# Patient Record
Sex: Male | Born: 1975 | Hispanic: Yes | Marital: Single | Smoking: Current every day smoker
Health system: Southern US, Community
[De-identification: ages and names within clinical notes are randomized; demographics above are authoritative.]

## PROBLEM LIST (undated history)

## (undated) DIAGNOSIS — R109 Unspecified abdominal pain: Secondary | ICD-10-CM

## (undated) HISTORY — DX: Unspecified abdominal pain: R10.9

---

## 2006-09-17 ENCOUNTER — Emergency Department: Payer: Self-pay

## 2012-12-21 ENCOUNTER — Emergency Department: Payer: Self-pay | Admitting: Internal Medicine

## 2014-10-01 IMAGING — CR DG CHEST 2V
1 series · 2 of 2 positions shown · non-contrast
Comparison: none

REASON FOR EXAM: cough
COMMENTS:

PROCEDURE:     DXR - DXR CHEST PA (OR AP) AND LATERAL  - December 21, 2012 [DATE]
RESULT:     The lungs are clear. The cardiac silhouette and visualized bony
skeleton are unremarkable.

[Series 1: w chest pa · 0.14mm/px · 2 of 2 slices shown]
[im 1/2]
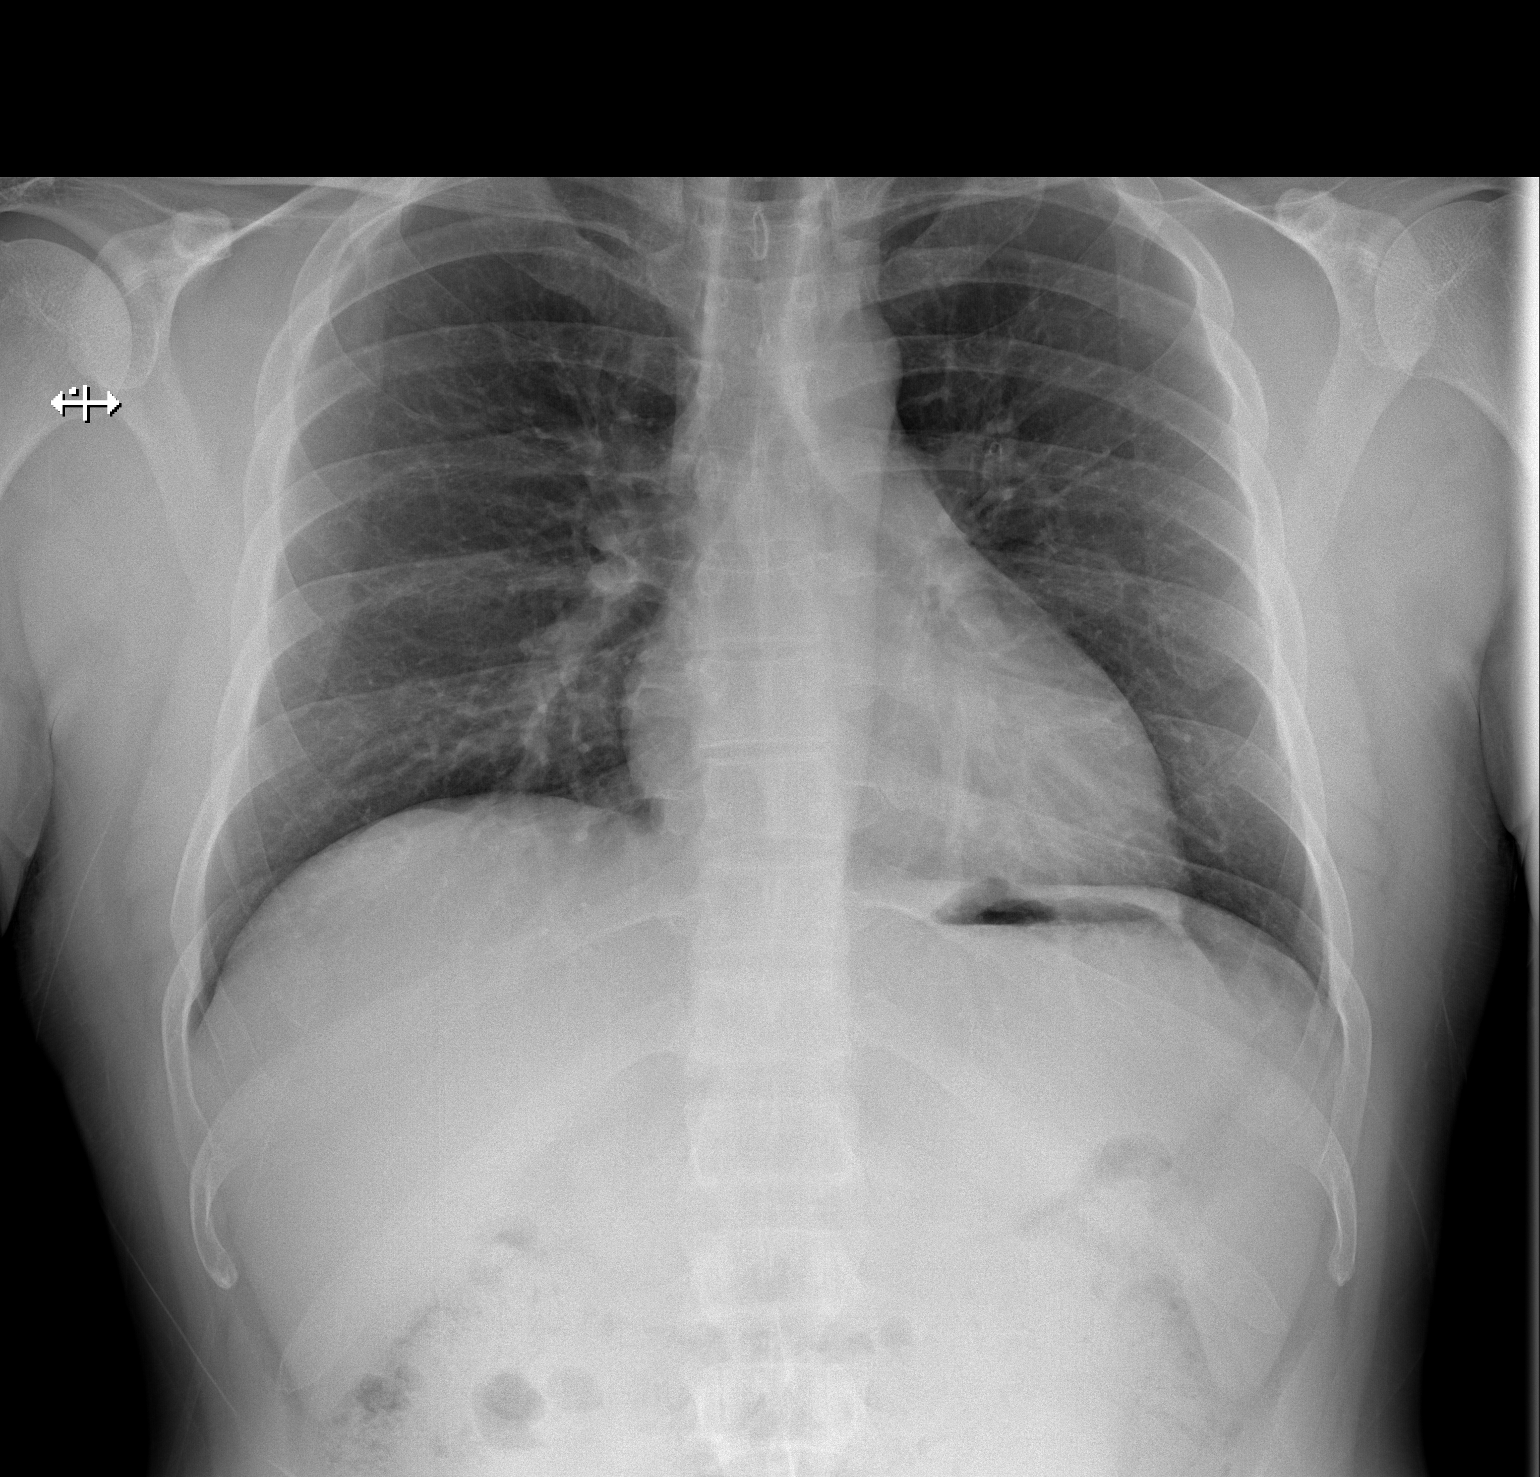
[im 2/2]
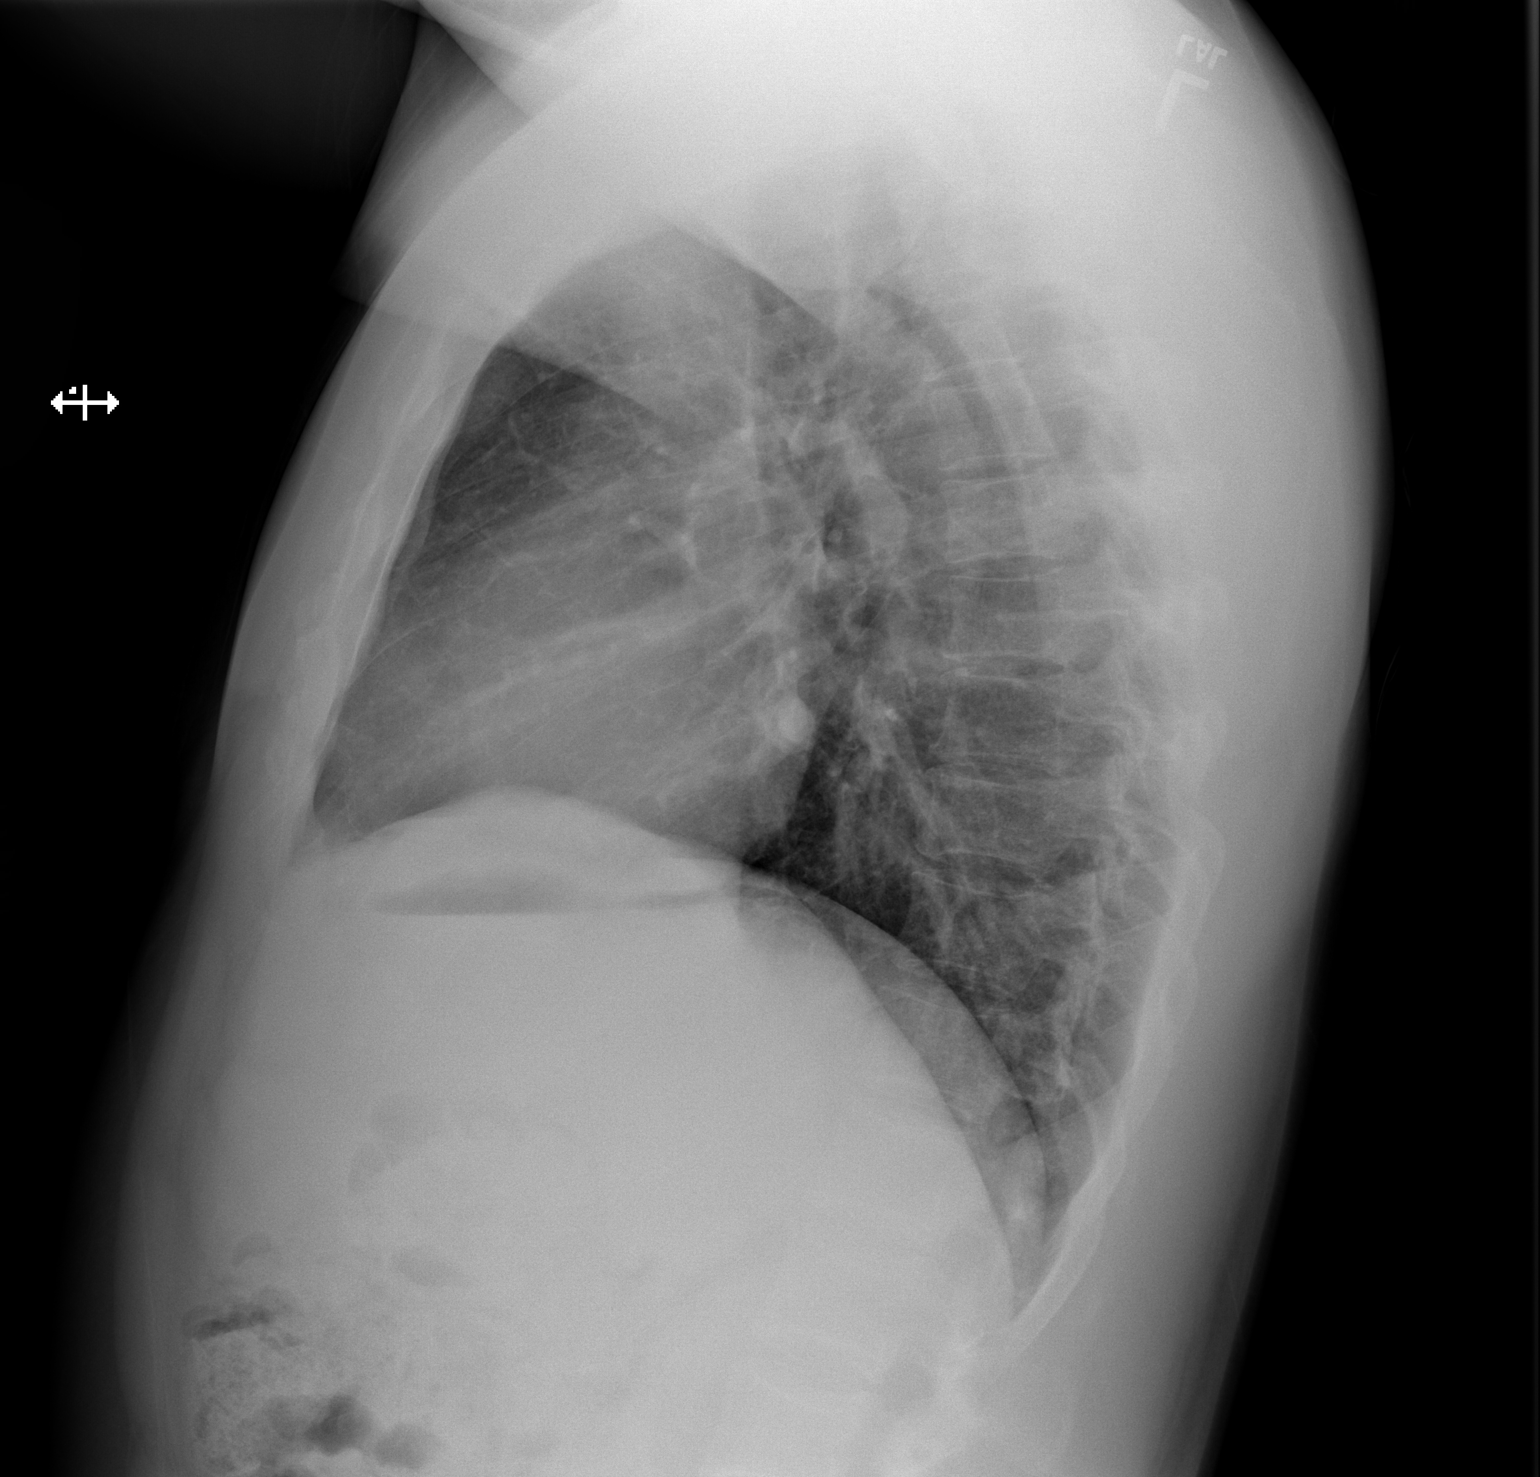

[2 of 2 positions shown; findings below may reference images not displayed]

IMPRESSION: 1. Chest radiograph without evidence of acute cardiopulmonary disease.

## 2015-03-20 ENCOUNTER — Encounter: Payer: Self-pay | Admitting: Medical Oncology

## 2015-03-20 ENCOUNTER — Other Ambulatory Visit: Payer: Self-pay

## 2015-03-20 ENCOUNTER — Emergency Department: Payer: BLUE CROSS/BLUE SHIELD

## 2015-03-20 ENCOUNTER — Emergency Department
Admission: EM | Admit: 2015-03-20 | Discharge: 2015-03-20 | Disposition: A | Discharge status: Home / Self Care | Payer: BLUE CROSS/BLUE SHIELD | Attending: Emergency Medicine | Admitting: Emergency Medicine

## 2015-03-20 DIAGNOSIS — K2970 Gastritis, unspecified, without bleeding: Secondary | ICD-10-CM | POA: Insufficient documentation

## 2015-03-20 DIAGNOSIS — K8020 Calculus of gallbladder without cholecystitis without obstruction: Secondary | ICD-10-CM | POA: Diagnosis not present

## 2015-03-20 DIAGNOSIS — R1013 Epigastric pain: Secondary | ICD-10-CM

## 2015-03-20 DIAGNOSIS — Z720 Tobacco use: Secondary | ICD-10-CM | POA: Insufficient documentation

## 2015-03-20 LAB — COMPREHENSIVE METABOLIC PANEL
ALBUMIN: 4.5 g/dL (ref 3.5–5.0)
ALT: 26 U/L (ref 17–63)
AST: 21 U/L (ref 15–41)
Alkaline Phosphatase: 66 U/L (ref 38–126)
Anion gap: 10 (ref 5–15)
BUN: 10 mg/dL (ref 6–20)
CALCIUM: 9.4 mg/dL (ref 8.9–10.3)
CO2: 27 mmol/L (ref 22–32)
Chloride: 99 mmol/L — ABNORMAL LOW (ref 101–111)
Creatinine, Ser: 0.84 mg/dL (ref 0.61–1.24)
GFR calc Af Amer: >60 mL/min (ref >60)
GFR calc non Af Amer: >60 mL/min (ref >60)
GLUCOSE: 147 mg/dL — AB (ref 65–99)
Potassium: 4.1 mmol/L (ref 3.5–5.1)
SODIUM: 136 mmol/L (ref 135–145)
Total Bilirubin: 0.4 mg/dL (ref 0.3–1.2)
Total Protein: 7.9 g/dL (ref 6.5–8.1)

## 2015-03-20 LAB — CBC WITH DIFFERENTIAL/PLATELET
BASOS ABS: 0 10*3/uL (ref 0–0.1)
BASOS PCT: 0 %
EOS ABS: 0 10*3/uL (ref 0–0.7)
Eosinophils Relative: 0 %
HCT: 48.1 % (ref 40.0–52.0)
Hemoglobin: 16 g/dL (ref 13.0–18.0)
Lymphocytes Relative: 6 %
Lymphs Abs: 0.6 10*3/uL — ABNORMAL LOW (ref 1.0–3.6)
MCH: 29.3 pg (ref 26.0–34.0)
MCHC: 33.2 g/dL (ref 32.0–36.0)
MCV: 88.1 fL (ref 80.0–100.0)
Monocytes Absolute: 0.2 10*3/uL (ref 0.2–1.0)
Monocytes Relative: 2 %
NEUTROS PCT: 92 %
Neutro Abs: 8.3 10*3/uL — ABNORMAL HIGH (ref 1.4–6.5)
PLATELETS: 293 10*3/uL (ref 150–440)
RBC: 5.46 MIL/uL (ref 4.40–5.90)
RDW: 12.8 % (ref 11.5–14.5)
WBC: 9 10*3/uL (ref 3.8–10.6)

## 2015-03-20 LAB — URINALYSIS COMPLETE WITH MICROSCOPIC (ARMC ONLY)
Bacteria, UA: NONE SEEN
Bilirubin Urine: NEGATIVE
Glucose, UA: NEGATIVE mg/dL
Hgb urine dipstick: NEGATIVE
Ketones, ur: NEGATIVE mg/dL
Leukocytes, UA: NEGATIVE
NITRITE: NEGATIVE
PH: 9 — AB (ref 5.0–8.0)
PROTEIN: NEGATIVE mg/dL
Specific Gravity, Urine: 1.015 (ref 1.005–1.030)
Squamous Epithelial / LPF: NONE SEEN

## 2015-03-20 LAB — LIPASE, BLOOD: Lipase: 24 U/L (ref 22–51)

## 2015-03-20 LAB — TROPONIN I: Troponin I: <0.03 ng/mL (ref <0.031)

## 2015-03-20 MED ORDER — ONDANSETRON 4 MG PO TBDP
4.0000 mg | ORAL_TABLET | Freq: Once | ORAL | Status: AC
  Administered 2015-03-20: 4 mg via ORAL
  Filled 2015-03-20: qty 1

## 2015-03-20 MED ORDER — GI COCKTAIL ~~LOC~~
30.0000 mL | Freq: Once | ORAL | Status: AC
  Administered 2015-03-20: 30 mL via ORAL
  Filled 2015-03-20: qty 30

## 2015-03-20 MED ORDER — HYDROCODONE-ACETAMINOPHEN 5-325 MG PO TABS: 1.0000 | ORAL_TABLET | Freq: Four times a day (QID) | ORAL | Status: DC | PRN

## 2015-03-20 MED ORDER — HYDROCODONE-ACETAMINOPHEN 5-325 MG PO TABS
1.0000 | ORAL_TABLET | Freq: Once | ORAL | Status: AC
  Administered 2015-03-20: 1 via ORAL

## 2015-03-20 MED ORDER — HYDROCODONE-ACETAMINOPHEN 5-325 MG PO TABS
ORAL_TABLET | ORAL | Status: AC
  Administered 2015-03-20: 1 via ORAL
  Filled 2015-03-20: qty 1

## 2015-03-20 MED ORDER — HYDROMORPHONE HCL 1 MG/ML IJ SOLN
1.0000 mg | Freq: Once | INTRAMUSCULAR | Status: AC
  Administered 2015-03-20: 1 mg via INTRAMUSCULAR
  Filled 2015-03-20: qty 1

## 2015-03-20 NOTE — ED Provider Notes (Signed)
[facility] Emergency Department Provider Note   ____________________________________________  Time seen: 11 AM I have reviewed the triage vital signs and the triage nursing note.  HISTORY  Chief Complaint Abdominal Pain and Emesis   Historian Patient, through Spanish interpreter  HPI Ricardo Curtis is a 39 y.o. male who woke up this morning around 2 AM having epigastric pain that is moderate to severe, constant and ongoing. Feels a little bit like burning. No chest pain. No shortness of breath or trouble breathing. He vomited times one which was nonbloody and nonbilious. No lower abdominal pain and no bowel changes. He does eat a lot of spicy food. Some mild history of GERD or indigestion but not recently. He tried Tums which didn't seem to help much.    History reviewed. No pertinent past medical history.  There are no active problems to display for this patient.   History reviewed. No pertinent past surgical history.  Current Outpatient Rx  Name  Route  Sig  Dispense  Refill  . HYDROcodone-acetaminophen (NORCO/VICODIN) 5-325 MG per tablet   Oral   Take 1 tablet by mouth every 6 (six) hours as needed for moderate pain.   5 tablet   0     Allergies Review of patient's allergies indicates no known allergies.  No family history on file.  Social History Social History  Substance Use Topics  . Smoking status: Current Every Day Smoker  . Smokeless tobacco: None  . Alcohol Use: Yes     Comment: occ    Review of Systems  Constitutional: Negative for fever. Eyes: Negative for visual changes. ENT: Negative for sore throat. Cardiovascular: Negative for chest pain. Respiratory: Negative for shortness of breath. Gastrointestinal: Negative for constipation or diarrhea. Genitourinary: Negative for dysuria. Musculoskeletal: Negative for back pain. Skin: Negative for rash. Neurological: Negative for headaches, focal weakness or  numbness. 10 point Review of Systems otherwise negative ____________________________________________   PHYSICAL EXAM:  VITAL SIGNS: ED Triage Vitals  Enc Vitals Group     BP 03/20/15 0943 135/78 mmHg     Pulse Rate 03/20/15 0943 49     Resp 03/20/15 0943 20     Temp 03/20/15 0943 97.8 F (36.6 C)     Temp Source 03/20/15 0943 Oral     SpO2 03/20/15 0943 99 %     Weight 03/20/15 0943 160 lb (72.576 kg)     Height --      Head Cir --      Peak Flow --      Pain Score 03/20/15 0950 10     Pain Loc --      Pain Edu? --      Excl. in GC? --      Constitutional: Alert and oriented. Well appearing and in no distress. Eyes: Conjunctivae are normal. PERRL. Normal extraocular movements. ENT   Head: Normocephalic and atraumatic.   Nose: No congestion/rhinnorhea.   Mouth/Throat: Mucous membranes are moist.   Neck: No stridor. Cardiovascular/Chest: Normal rate, regular rhythm.  No murmurs, rubs, or gallops. Respiratory: Normal respiratory effort without tachypnea nor retractions. Breath sounds are clear and equal bilaterally. No wheezes/rales/rhonchi. Gastrointestinal: Soft. No distention, no guarding, no rebound. Moderate tenderness in the epigastrium. No focal right upper quadrant tenderness. No lower abdominal tenderness and no McBurney's point tenderness. Genitourinary/rectal:Deferred Musculoskeletal: Nontender with normal range of motion in all extremities. No joint effusions.  No lower extremity tenderness nor edema. Neurologic:  Normal speech and language. No gross or  focal neurologic deficits are appreciated. Skin:  Skin is warm, dry and intact. No rash noted. Psychiatric: Mood and affect are normal. Speech and behavior are normal. Patient exhibits appropriate insight and judgment.  ____________________________________________   EKG I, Governor Rooks, MD, the attending physician have personally viewed and interpreted all ECGs.  49 bpm. Her sinus bradycardia.  Narrow QRS. Normal axis. Normal ST and T-wave. ____________________________________________  LABS (pertinent positives/negatives)  Urinalysis negative Complete metabolic panel without significant abnormality, including normal LFTs Lipase 24 CBC within normal limits Troponin less than 0.03  ____________________________________________  RADIOLOGY All Xrays were viewed by me. Imaging interpreted by Radiologist.  Ultrasound right upper quadrant: cholelithiasis no biliary dilatation __________________________________________  PROCEDURES  Procedure(s) performed: None Critical Care performed: None  ____________________________________________   ED COURSE / ASSESSMENT AND PLAN  CONSULTATIONS: None  Pertinent labs & imaging results that were available during my care of the patient were reviewed by me and considered in my medical decision making (see chart for details).  Clinically patient has symptoms of GERD/gastritis.symptoms are not significantly improved with jet cocktail. For symptom control and to give him 1 dose of Dilaudid with Zofran. Given the upper abdominal tenderness although labs are reassuring, I initially tried to just about every for gastritis. However the patient has continued pain after check cocktail. I obtained a right upper quadrant ultrasound was positive for gallstones, however no sign of obstruction or cholecystitis. I discussed this with the patient through the interpreter. Patient pain was under control after Dilaudid. Patient will be discharged with some Norco. Referred to surgery and primary care.  Patient / Family / Caregiver informed of clinical course, medical decision-making process, and agree with plan.   I discussed return precautions, follow-up instructions, and discharged instructions with patient and/or family.  ___________________________________________   FINAL CLINICAL IMPRESSION(S) / ED DIAGNOSES   Final diagnoses:  Gastritis   Epigastric pain  Gallstones    FOLLOW UP  Referred to: primary care physician referred to [facility] clinic, one week. Refer to surgery Dr. Michela Pitcher.   Governor Rooks, MD 03/20/15 [phone]

## 2015-03-20 NOTE — Discharge Instructions (Signed)
Your ultrasound showed signs of gallstones. I'm not sure if your pain is coming from stomach acid reflux or possibly gallstones.  Avoid spicy foods, caffeine, citrus and acidic foods for 2 weeks. Try over-the-counter acid reducer Prilosec 40 mg daily for 10 days.  Return to the emergency department for any worsening condition including worsening abdominal pain, any chest pain, sweating, trouble breathing, fever, black or bloody stools, or any other symptoms concerning to you. Follow-up with primary care physician, and you're referred to the [place] clinic.also recommend follow-up with General surgeon Dr. Michela Pitcher.  Ultrasonido muestra sintomas de piedras en la vesicula. Evite comidas picantes, cafeina, comidas o jugos acidos. Tome Prilosec 40 mg diario por 10 dias.  Regrese a emergencia si sintomas empeoran. Seguimiento con su doctor de cabecera y un cirujano Dr. Michela Pitcher.               Gastritis - Adultos  (Gastritis, Adult)  Gastrittis es la hinchazn e irritacin (inflamacin) del revestimiento interno del estmago. Si no recibe tratamiento, la gastritis puede causar sangrado y llagas.(lceras) en el estmago. CUIDADOS EN EL HOGAR   Slo tome los medicamentos segn le indique el mdico.  Si le han recetado antibiticos, tmelos segn las indicaciones. Termine de [employer], aunque comience a sentirse mejor.  Beba gran cantidad de lquido para mantener el pis (orina) de tono claro o amarillo plido.  Evite las comidas y bebidas que [employer]. Los alimentos que debe evitar son:  G[place] y alcohol.  Chocolate.  Menta.  Ajo y cebolla.  Comidas muy condimentadas.  Ctricos como naranjas, limones o limas.  Alimentos que contengan tomate, como salsas, [place] y pizza.  Alimentos fritos y Lexicographer.  Haga comidas pequeas durante Glass blower/designer de 3 comidas abundantes. SOLICITE AYUDA DE INMEDIATO SI:   La materia fecal (heces)es negra o de color rojo  oscuro.  Vomita sangre. Puede ser similar a la borra del caf  No puede retener los lquidos.  El dolor en el vientre (abdominal) empeora.  Tiene fiebre.  No mejora luego de 1 semana.  Tiene preguntas o preocupaciones. ASEGRESE DE QUE:   Comprende estas instrucciones.  Controlar su enfermedad.  Solicitar ayuda de inmediato si no mejora o si empeora. Document Released: 01/26/2012 [facility] Patient Information 2015 [place], [place]. This information is not intended to replace advice given to you by your health care provider. Make sure you discuss any questions you have with your health care provider.   Colelitiasis (Cholelithiasis) La colelitiasis (tambin llamada clculos en la vescula) es una enfermedad de la vescula. La vescula es un rgano pequeo que ayuda a digerir las [place]. Los sntomas de clculos en la vescula son:  Caleen [place] de vomitar (nuseas).  Devolver la comida (vomitar).  Dolor en el vientre.  Piel amarilla (ictericia)  Dolor sbito. Podr sentir Chief Technology Officer durante algunos minutos o unas horas.  Grant Ruts.  Dolor a Insurance claims handler. CUIDADOS EN EL HOGAR  Tome slo los medicamentos segn le haya indicado el mdico.  Siga una dieta baja en grasas hasta que vuelva a ver al mdico nuevamente. Consumir grasas puede Programmer, multimedia.  Concurra a las consultas de control con el mdico, segn las indicaciones. Los ataques generalmente ocurren repetidas veces. Generalmente se requiere de [employer]. SOLICITE AYUDA DE INMEDIATO SI:   El dolor empeora.  El dolor no se alivia con los [employer].  Tiene fiebre o sntomas que persisten durante ms de 2-3 das.  Tiene fiebre y los sntomas  empeoran repentinamente.  Siente nuseas y vomita. ASEGRESE DE QUE:   Comprende estas instrucciones.  Controlar su afeccin.  Recibir ayuda de inmediato si no mejora o si empeora. Document Released: 10/23/2008 Document Revised:  03/29/2013 [facility] Patient Information 2015 [place], [place]. This information is not intended to replace advice given to you by your health care provider. Make sure you discuss any questions you have with your health care provider.

## 2015-03-20 NOTE — ED Notes (Signed)
Assumed care of patient from [place], [place]. Patient resting comfortably. Family at bedside. States pain is still the same and still in center of abdomen.

## 2015-03-20 NOTE — ED Notes (Signed)
Pt to triage with reports that he began having epigastic pain this am around 0200 this am, pt reports he vomited x 1 since then.

## 2015-03-21 ENCOUNTER — Telehealth: Payer: Self-pay

## 2015-03-21 ENCOUNTER — Ambulatory Visit: Payer: Self-pay | Admitting: Surgery

## 2015-03-21 NOTE — Telephone Encounter (Signed)
Patient called me regarding his condition. He stated that he continues to have abdominal pain and was requesting a refill on his Norco. I told him that because we have not seen him in the office, that we are not able to refill his Norco. I did advise him to alternate taking Ibuprofen every six hours and Acetaminophen every four hours for his pain. I also told patient that if his pain was unbearable to please go to the emergency room. Patient understood and had no further questions. I did remind him that if he didn't go to the emergency room, that we would see him on Monday at the [facility] office. Patient understood as well.

## 2015-03-25 ENCOUNTER — Ambulatory Visit (INDEPENDENT_AMBULATORY_CARE_PROVIDER_SITE_OTHER): Payer: BLUE CROSS/BLUE SHIELD | Admitting: Surgery

## 2015-03-25 ENCOUNTER — Encounter: Payer: Self-pay | Admitting: Surgery

## 2015-03-25 VITALS — BP 133/74 | HR 60 | Temp 98.1°F | Ht 66.0 in | Wt 185.0 lb

## 2015-03-25 DIAGNOSIS — R1011 Right upper quadrant pain: Secondary | ICD-10-CM

## 2015-03-25 NOTE — H&P (Signed)
CC: RUQ pain  HPI: Ricardo Curtis is a pleasant 38 yo M who presents following 1 episode of RUQ/epigastric pain 2 days ago. Was seen in ER and diagnosed with gallstones.  No pain currently.  First episode of pain.  No fevers/chills, night sweats, shortness of breath, cough, chest pain, diarrhea/constipation, dysuria/hematuria.    Active Ambulatory Problems    Diagnosis Date Noted  . Abdominal pain, right upper quadrant 03/25/2015   Resolved Ambulatory Problems    Diagnosis Date Noted  . No Resolved Ambulatory Problems   Past Medical History  Diagnosis Date  . Abdominal pain    History reviewed. No pertinent past surgical history.   No Known Allergies   Social History   Social History  . Marital Status: Married    Spouse Name: N/A  . Number of Children: N/A  . Years of Education: N/A   Occupational History  . Not on file.   Social History Main Topics  . Smoking status: Current Every Day Smoker  . Smokeless tobacco: Not on file  . Alcohol Use: 0.0 oz/week    0 Standard drinks or equivalent per week     Comment: occ  . Drug Use: No  . Sexual Activity: Not on file   Other Topics Concern  . Not on file   Social History Narrative   Family History  Problem Relation Age of Onset  . Alcohol abuse Neg Hx   . Arthritis Neg Hx   . Asthma Neg Hx   . Birth defects Neg Hx   . Cancer Neg Hx   . COPD Neg Hx   . Depression Neg Hx   . Diabetes Neg Hx   . Drug abuse Neg Hx   . Early death Neg Hx   . Hearing loss Neg Hx   . Heart disease Neg Hx   . Hyperlipidemia Neg Hx   . Hypertension Neg Hx   . Kidney disease Neg Hx   . Learning disabilities Neg Hx   . Mental illness Neg Hx   . Mental retardation Neg Hx   . Miscarriages / Stillbirths Neg Hx   . Stroke Neg Hx   . Vision loss Neg Hx   . Varicose Veins Neg Hx    ROS: Full ROS obtained, pertinent positives and negatives as above  Blood pressure 133/74, pulse 60, temperature 98.1 F (36.7 C), temperature source Oral,  height 5' 6" (1.676 m), weight 185 lb (83.915 kg). GEN: NAD/A&Ox3 FACE: no obvious facial trauma, normal external nose, normal external ears EYES: no scleral icterus, no conjunctivitis HEAD: normocephalic atraumatic CV: RRR, no MRG RESP: moving air well, lungs clear ABD: soft, nontender, nondistended EXT: moving all ext well, strength 5/5 NEURO: cnII-XII grossly intact, sensation intact all 4 ext  Labs: Personally reviewed, significant for  WBC 9.0, 92% Neutrophils  U/S: Personally reviewed, significant for Gallstones  A/P 38 yo M with recent diagnosis of gallstones and 1 episode, now resolved, of RUQ/epigastric pain.  I have told him that there is a low chance of this recurring but have offered cholecystectomy if patient is fearful of subsequent episodes.  I do feel like his pain is biliary in nature.  He would like to proceed.  I have discussed the risks and benefits associated with lap cholecystectomy and he expressed understanding.   

## 2015-03-25 NOTE — Progress Notes (Signed)
CC: RUQ pain  HPI: [name] is a pleasant 39 yo M who presents following 1 episode of RUQ/epigastric pain 2 days ago. Was seen in ER and diagnosed with gallstones.  No pain currently.  First episode of pain.  No fevers/chills, night sweats, shortness of breath, cough, chest pain, diarrhea/constipation, dysuria/hematuria.    Active Ambulatory Problems    Diagnosis Date Noted  . Abdominal pain, right upper quadrant 03/25/2015   Resolved Ambulatory Problems    Diagnosis Date Noted  . No Resolved Ambulatory Problems   Past Medical History  Diagnosis Date  . Abdominal pain    History reviewed. No pertinent past surgical history.   No Known Allergies   Social History   Social History  . Marital Status: Married    Spouse Name: N/A  . Number of Children: N/A  . Years of Education: N/A   Occupational History  . Not on file.   Social History Main Topics  . Smoking status: Current Every Day Smoker  . Smokeless tobacco: Not on file  . Alcohol Use: 0.0 oz/week    0 Standard drinks or equivalent per week     Comment: occ  . Drug Use: No  . Sexual Activity: Not on file   Other Topics Concern  . Not on file   Social History Narrative   Family History  Problem Relation Age of Onset  . Alcohol abuse Neg Hx   . Arthritis Neg Hx   . Asthma Neg Hx   . Birth defects Neg Hx   . Cancer Neg Hx   . COPD Neg Hx   . Depression Neg Hx   . Diabetes Neg Hx   . Drug abuse Neg Hx   . Early death Neg Hx   . Hearing loss Neg Hx   . Heart disease Neg Hx   . Hyperlipidemia Neg Hx   . Hypertension Neg Hx   . Kidney disease Neg Hx   . Learning disabilities Neg Hx   . Mental illness Neg Hx   . Mental retardation Neg Hx   . Miscarriages / Stillbirths Neg Hx   . Stroke Neg Hx   . Vision loss Neg Hx   . Varicose Veins Neg Hx    ROS: Full ROS obtained, pertinent positives and negatives as above  Blood pressure 133/74, pulse 60, temperature 98.1 F (36.7 C), temperature source Oral,  height  (1.676 m), weight 185 lb (83.915 kg). GEN: NAD/A&Ox3 FACE: no obvious facial trauma, normal external nose, normal external ears EYES: no scleral icterus, no conjunctivitis HEAD: normocephalic atraumatic CV: RRR, no MRG RESP: moving air well, lungs clear ABD: soft, nontender, nondistended EXT: moving all ext well, strength 5/5 NEURO: cnII-XII grossly intact, sensation intact all 4 ext  Labs: Personally reviewed, significant for  WBC 9.0, 92% Neutrophils  U/S: Personally reviewed, significant for Gallstones  A/P 39 yo M with recent diagnosis of gallstones and 1 episode, now resolved, of RUQ/epigastric pain.  I have told him that there is a low chance of this recurring but have offered cholecystectomy if patient is fearful of subsequent episodes.  I do feel like his pain is biliary in nature.  He would like to proceed.  I have discussed the risks and benefits associated with lap cholecystectomy and he expressed understanding.

## 2015-03-25 NOTE — Patient Instructions (Signed)
Si tiene preguntas antes de su operacion, llamenos.

## 2015-03-29 ENCOUNTER — Telehealth: Payer: Self-pay | Admitting: Surgery

## 2015-03-29 NOTE — Telephone Encounter (Signed)
Pt advised of pre op date/time and sx date. Sx: 04/08/15 with Dr Randa Lynn chole Pre op: 04/02/15 @ 9:30am--office.   Pt was advised of physician estimate--[phone] 0.00%co insurance Remaining ded--[phone]  Pt stated that he had another insurance and would call the office back with that information.

## 2015-04-02 ENCOUNTER — Encounter
Admission: RE | Admit: 2015-04-02 | Discharge: 2015-04-02 | Disposition: A | Discharge status: Home / Self Care | Payer: BLUE CROSS/BLUE SHIELD | Source: Ambulatory Visit | Attending: Surgery | Admitting: Surgery

## 2015-04-02 ENCOUNTER — Encounter: Payer: Self-pay | Admitting: *Deleted

## 2015-04-02 ENCOUNTER — Telehealth: Payer: Self-pay | Admitting: Surgery

## 2015-04-02 DIAGNOSIS — K8010 Calculus of gallbladder with chronic cholecystitis without obstruction: Secondary | ICD-10-CM | POA: Diagnosis not present

## 2015-04-02 DIAGNOSIS — K821 Hydrops of gallbladder: Secondary | ICD-10-CM | POA: Diagnosis present

## 2015-04-02 DIAGNOSIS — F17200 Nicotine dependence, unspecified, uncomplicated: Secondary | ICD-10-CM | POA: Diagnosis not present

## 2015-04-02 NOTE — Patient Instructions (Signed)
  Your procedure is scheduled on: 04/08/15 Report to Day Surgery. To find out your arrival time please call [phone] between 1PM - 3PM on 04/05/15  Remember: Instructions that are not followed completely may result in serious medical risk, up to and including death, or upon the discretion of your surgeon and anesthesiologist your surgery may need to be rescheduled.    ___x_ 1. Do not eat food or drink liquids after midnight. No gum chewing or hard candies.     ___x_ 2. No Alcohol for 24 hours before or after surgery.   ____ 3. Bring all medications with you on the day of surgery if instructed.    ____ 4. Notify your doctor if there is any change in your medical condition     (cold, fever, infections).     Do not wear jewelry, make-up, hairpins, clips or nail polish.  Do not wear lotions, powders, or perfumes. You may wear deodorant.  Do not shave 48 hours prior to surgery. Men may shave face and neck.  Do not bring valuables to the hospital.    [facility] is not responsible for any belongings or valuables.               Contacts, dentures or bridgework may not be worn into surgery.  Leave your suitcase in the car. After surgery it may be brought to your room.  For patients admitted to the hospital, discharge time is determined by your                treatment team.   Patients discharged the day of surgery will not be allowed to drive home.   Please read over the following fact sheets that you were given:   Surgical Site Infection Prevention   ____ Take these medicines the morning of surgery with A SIP OF WATER:    1.   2.   3.   4.  5.  6.  ____ Fleet Enema (as directed)   __x__ Use CHG Soap as directed  ____ Use inhalers on the day of surgery  ____ Stop metformin 2 days prior to surgery    ____ Take 1/2 of usual insulin dose the night before surgery and none on the morning of surgery.   ____ Stop Coumadin/Plavix/aspirin on   ____ Stop Anti-inflammatories on     ____ Stop supplements until after surgery.    ____ Bring C-Pap to the hospital.

## 2015-04-02 NOTE — Telephone Encounter (Signed)
Called patient no answer. Unable to leave voice mail due to mail box not being set up.

## 2015-04-02 NOTE — Telephone Encounter (Signed)
Patient would like you to call him regarding some disability papers. Can you please call him today because he is working all day the rest of the week.

## 2015-04-08 ENCOUNTER — Encounter: Payer: Self-pay | Admitting: *Deleted

## 2015-04-08 ENCOUNTER — Encounter
Admission: RE | Disposition: A | Discharge status: Home / Self Care | Payer: Self-pay | Source: Ambulatory Visit | Attending: Surgery

## 2015-04-08 ENCOUNTER — Ambulatory Visit
Admission: RE | Admit: 2015-04-08 | Discharge: 2015-04-08 | Disposition: A | Discharge status: Home / Self Care | Payer: BLUE CROSS/BLUE SHIELD | Source: Ambulatory Visit | Attending: Surgery | Admitting: Surgery

## 2015-04-08 ENCOUNTER — Ambulatory Visit: Payer: BLUE CROSS/BLUE SHIELD | Admitting: Anesthesiology

## 2015-04-08 DIAGNOSIS — K8020 Calculus of gallbladder without cholecystitis without obstruction: Secondary | ICD-10-CM | POA: Diagnosis not present

## 2015-04-08 DIAGNOSIS — K821 Hydrops of gallbladder: Secondary | ICD-10-CM | POA: Insufficient documentation

## 2015-04-08 DIAGNOSIS — K8010 Calculus of gallbladder with chronic cholecystitis without obstruction: Secondary | ICD-10-CM | POA: Diagnosis not present

## 2015-04-08 DIAGNOSIS — F17200 Nicotine dependence, unspecified, uncomplicated: Secondary | ICD-10-CM | POA: Insufficient documentation

## 2015-04-08 HISTORY — PX: CHOLECYSTECTOMY: SHX55

## 2015-04-08 SURGERY — LAPAROSCOPIC CHOLECYSTECTOMY
Anesthesia: General | Wound class: Clean Contaminated

## 2015-04-08 MED ORDER — PROPOFOL 10 MG/ML IV BOLUS
INTRAVENOUS | Status: DC | PRN
  Administered 2015-04-08: 150 mg via INTRAVENOUS

## 2015-04-08 MED ORDER — LACTATED RINGERS IV SOLN
INTRAVENOUS | Status: DC
  Administered 2015-04-08: 08:00:00 via INTRAVENOUS

## 2015-04-08 MED ORDER — GLYCOPYRROLATE 0.2 MG/ML IJ SOLN
INTRAMUSCULAR | Status: DC | PRN
  Administered 2015-04-08: 0.2 mg via INTRAVENOUS
  Administered 2015-04-08: 0.6 mg via INTRAVENOUS

## 2015-04-08 MED ORDER — FAMOTIDINE 20 MG PO TABS
20.0000 mg | ORAL_TABLET | Freq: Once | ORAL | Status: AC
  Administered 2015-04-08: 20 mg via ORAL

## 2015-04-08 MED ORDER — LIDOCAINE HCL (PF) 1 % IJ SOLN
INTRAMUSCULAR | Status: AC
  Filled 2015-04-08: qty 30

## 2015-04-08 MED ORDER — MIDAZOLAM HCL 2 MG/2ML IJ SOLN
INTRAMUSCULAR | Status: DC | PRN
  Administered 2015-04-08: 2 mg via INTRAVENOUS

## 2015-04-08 MED ORDER — LIDOCAINE HCL (PF) 1 % IJ SOLN: INTRAMUSCULAR | Status: DC | PRN

## 2015-04-08 MED ORDER — SUCCINYLCHOLINE CHLORIDE 20 MG/ML IJ SOLN
INTRAMUSCULAR | Status: DC | PRN
  Administered 2015-04-08: 100 mg via INTRAVENOUS

## 2015-04-08 MED ORDER — ROCURONIUM BROMIDE 100 MG/10ML IV SOLN
INTRAVENOUS | Status: DC | PRN
  Administered 2015-04-08: 30 mg via INTRAVENOUS

## 2015-04-08 MED ORDER — LIDOCAINE HCL (CARDIAC) 20 MG/ML IV SOLN
INTRAVENOUS | Status: DC | PRN
  Administered 2015-04-08: 60 mg via INTRAVENOUS

## 2015-04-08 MED ORDER — FENTANYL CITRATE (PF) 100 MCG/2ML IJ SOLN: 25.0000 ug | INTRAMUSCULAR | Status: DC | PRN

## 2015-04-08 MED ORDER — FENTANYL CITRATE (PF) 100 MCG/2ML IJ SOLN
INTRAMUSCULAR | Status: DC | PRN
Start: 2015-04-08 — End: 2015-04-08
  Administered 2015-04-08 (×2): 50 ug via INTRAVENOUS

## 2015-04-08 MED ORDER — KETOROLAC TROMETHAMINE 30 MG/ML IJ SOLN
INTRAMUSCULAR | Status: DC | PRN
  Administered 2015-04-08: 30 mg via INTRAVENOUS

## 2015-04-08 MED ORDER — DEXAMETHASONE SODIUM PHOSPHATE 4 MG/ML IJ SOLN
INTRAMUSCULAR | Status: DC | PRN
  Administered 2015-04-08: 5 mg via INTRAVENOUS

## 2015-04-08 MED ORDER — ONDANSETRON HCL 4 MG/2ML IJ SOLN: 4.0000 mg | Freq: Once | INTRAMUSCULAR | Status: DC | PRN

## 2015-04-08 MED ORDER — BUPIVACAINE-EPINEPHRINE (PF) 0.25% -1:200000 IJ SOLN
INTRAMUSCULAR | Status: AC
  Filled 2015-04-08: qty 30

## 2015-04-08 MED ORDER — OXYCODONE-ACETAMINOPHEN 5-325 MG PO TABS: 1.0000 | ORAL_TABLET | ORAL | Status: DC | PRN

## 2015-04-08 MED ORDER — EPHEDRINE SULFATE 50 MG/ML IJ SOLN
INTRAMUSCULAR | Status: DC | PRN
  Administered 2015-04-08: 10 mg via INTRAVENOUS

## 2015-04-08 MED ORDER — CEFAZOLIN SODIUM-DEXTROSE 2-3 GM-% IV SOLR
2.0000 g | INTRAVENOUS | Status: AC
  Administered 2015-04-08: 2 g via INTRAVENOUS

## 2015-04-08 MED ORDER — FAMOTIDINE 20 MG PO TABS
ORAL_TABLET | ORAL | Status: AC
  Administered 2015-04-08: 20 mg via ORAL
  Filled 2015-04-08: qty 1

## 2015-04-08 MED ORDER — LIDOCAINE HCL 1 % IJ SOLN
INTRAMUSCULAR | Status: DC | PRN
  Administered 2015-04-08: 10 mL via INTRAMUSCULAR

## 2015-04-08 MED ORDER — NEOSTIGMINE METHYLSULFATE 10 MG/10ML IV SOLN
INTRAVENOUS | Status: DC | PRN
  Administered 2015-04-08: 3 mg via INTRAVENOUS

## 2015-04-08 MED ORDER — CEFAZOLIN SODIUM-DEXTROSE 2-3 GM-% IV SOLR
INTRAVENOUS | Status: AC
  Administered 2015-04-08: 2 g via INTRAVENOUS
  Filled 2015-04-08: qty 50

## 2015-04-08 MED ORDER — ONDANSETRON HCL 4 MG/2ML IJ SOLN
INTRAMUSCULAR | Status: DC | PRN
  Administered 2015-04-08: 4 mg via INTRAVENOUS

## 2015-04-08 SURGICAL SUPPLY — 51 items
APPLIER CLIP ROT 10 11.4 M/L (STAPLE) ×3
BAG COUNTER SPONGE EZ (MISCELLANEOUS) IMPLANT
BENZOIN TINCTURE PRP APPL 2/3 (GAUZE/BANDAGES/DRESSINGS) ×3 IMPLANT
BLADE CLIPPER SURG (BLADE) ×3 IMPLANT
BLADE SURG SZ11 CARB STEEL (BLADE) ×3 IMPLANT
BULB RESERV EVAC DRAIN JP 100C (MISCELLANEOUS) IMPLANT
CANISTER SUCT 1200ML W/VALVE (MISCELLANEOUS) ×3 IMPLANT
CATH REDDICK CHOLANGI 4FR 50CM (CATHETERS) IMPLANT
CHLORAPREP W/TINT 26ML (MISCELLANEOUS) ×3 IMPLANT
CLIP APPLIE ROT 10 11.4 M/L (STAPLE) ×1 IMPLANT
CLOSURE WOUND 1/2 X4 (GAUZE/BANDAGES/DRESSINGS) ×1
CONRAY 60ML FOR OR (MISCELLANEOUS) IMPLANT
COUNTER SPONGE BAG EZ (MISCELLANEOUS)
DISSECTOR KITTNER STICK (MISCELLANEOUS) IMPLANT
DISSECTORS/KITTNER STICK (MISCELLANEOUS)
DRAIN CHANNEL JP 19F (MISCELLANEOUS) IMPLANT
DRAPE SHEET LG 3/4 BI-LAMINATE (DRAPES) ×3 IMPLANT
DRSG TEGADERM 2-3/8X2-3/4 SM (GAUZE/BANDAGES/DRESSINGS) ×3 IMPLANT
DRSG TELFA 3X8 NADH (GAUZE/BANDAGES/DRESSINGS) ×3 IMPLANT
ENDOLOOP SUT PDS II  0 18 (SUTURE)
ENDOLOOP SUT PDS II 0 18 (SUTURE) IMPLANT
ENDOPOUCH RETRIEVER 10 (MISCELLANEOUS) ×3 IMPLANT
GLOVE BIO SURGEON STRL SZ7.5 (GLOVE) ×15 IMPLANT
GOWN STRL REUS W/ TWL LRG LVL3 (GOWN DISPOSABLE) ×3 IMPLANT
GOWN STRL REUS W/TWL LRG LVL3 (GOWN DISPOSABLE) ×6
IRRIGATION STRYKERFLOW (MISCELLANEOUS) ×1 IMPLANT
IRRIGATOR STRYKERFLOW (MISCELLANEOUS) ×3
IV CATH ANGIO 12GX3 LT BLUE (NEEDLE) IMPLANT
IV NS 1000ML (IV SOLUTION) ×2
IV NS 1000ML BAXH (IV SOLUTION) ×1 IMPLANT
LABEL OR SOLS (LABEL) ×3 IMPLANT
LIQUID BAND (GAUZE/BANDAGES/DRESSINGS) IMPLANT
NEEDLE HYPO 25X1 1.5 SAFETY (NEEDLE) ×3 IMPLANT
NS IRRIG 500ML POUR BTL (IV SOLUTION) ×3 IMPLANT
PACK LAP CHOLECYSTECTOMY (MISCELLANEOUS) ×3 IMPLANT
PAD GROUND ADULT SPLIT (MISCELLANEOUS) ×3 IMPLANT
SCISSORS METZENBAUM CVD 33 (INSTRUMENTS) ×3 IMPLANT
SEAL FOR SCOPE WARMER C3101 (MISCELLANEOUS) ×3 IMPLANT
SLEEVE ENDOPATH XCEL 5M (ENDOMECHANICALS) ×3 IMPLANT
STRAP SAFETY BODY (MISCELLANEOUS) ×3 IMPLANT
STRIP CLOSURE SKIN 1/2X4 (GAUZE/BANDAGES/DRESSINGS) ×2 IMPLANT
SUT MNCRL 4-0 (SUTURE) ×2
SUT MNCRL 4-0 27XMFL (SUTURE) ×1
SUT VICRYL 0 AB UR-6 (SUTURE) ×3 IMPLANT
SUTURE MNCRL 4-0 27XMF (SUTURE) ×1 IMPLANT
SWABSTK COMLB BENZOIN TINCTURE (MISCELLANEOUS) IMPLANT
TROCAR XCEL BLUNT TIP 100MML (ENDOMECHANICALS) ×3 IMPLANT
TROCAR XCEL NON-BLD 11X100MML (ENDOMECHANICALS) ×3 IMPLANT
TROCAR XCEL NON-BLD 5MMX100MML (ENDOMECHANICALS) ×3 IMPLANT
TUBING INSUFFLATOR HI FLOW (MISCELLANEOUS) ×3 IMPLANT
WATER STERILE IRR 1000ML POUR (IV SOLUTION) ×3 IMPLANT

## 2015-04-08 NOTE — Op Note (Addendum)
Preop dx: Gallbladder hydrops Postop dx: Same Procedure performed: Laparoscopic cholecystectomy Anesthesia: General EBL: 10 ml Complications: None Specimen: gallbladder  Indication for surgery: Mr. Dubois is a pleasant 39 yo F who presents with RUQ pain and gallstones. His pain thought to be biliary in nature so I offered cholecystectomy.  Details of surgery: Informed consent was obtained.  Ms. Lamson was brought to the OR suite and laid supine on the OR table.  He was induced, ETT was placed, general anesthesia was administered.  His abdomen was prepped and draped.  A timeout was performed correctly identifying patient name, operative site and procedure to be performed.  A supraumbilical incision was made and deepened to the fascia.  The fascia was incised, peritoneum was entered.  Two stay sutures were placed through the fasciotomy.  Hassan trocar was placed and abdomen was insufflated.  An 11mm epigastric and 2 5 mm subcostal trocars were placed.  Gallbladder was minimally inflamed.  Cystic duct and cystic artery were dissected out and critical view was obtained.  Cystic artery and cystic duct were clipped and ligated.  Gallbladder was then taken off fossa and removed through umbilicus. Fossa was made hemostatic and irrigated until hemostasis obtained.  Trocars were then removed and abdomen desufflated.  Supraumbilical fascia was then closed with previously placed stay sutures.  Skin was then closed with interrupted deep dermal 4-0 monocryl.  Suture strips, telfa and tegaderm were used to dress incision.  Patient was then awoken, extubated and brought to PACU.  There were no immediate complications. Needle, sponge and instrument count was correct at the end of the procedure.

## 2015-04-08 NOTE — Transfer of Care (Signed)
Immediate Anesthesia Transfer of Care Note  Patient: Ricardo Curtis  Procedure(s) Performed: Procedure(s): LAPAROSCOPIC CHOLECYSTECTOMY (N/A)  Patient Location: PACU  Anesthesia Type:General  Level of Consciousness: awake, alert  and oriented  Airway & Oxygen Therapy: Patient Spontanous Breathing and Patient connected to face mask oxygen  Post-op Assessment: Report given to RN and Post -op Vital signs reviewed and stable  Post vital signs: Reviewed and stable  Last Vitals:  Filed Vitals:   04/08/15 1011  BP: 136/69  Pulse: 78  Temp: 36.6 C  Resp: 18    Complications: No apparent anesthesia complications

## 2015-04-08 NOTE — Progress Notes (Signed)
I have seen and evaluated Ricardo Curtis.  No acute issues.  No changes from office H and P.  Proceed with surgery.

## 2015-04-08 NOTE — Anesthesia Procedure Notes (Signed)
Procedure Name: Intubation Date/Time: 04/08/2015 9:13 AM Performed by: Irving Burton Pre-anesthesia Checklist: Patient identified, Emergency Drugs available, Suction available and Patient being monitored Patient Re-evaluated:Patient Re-evaluated prior to inductionOxygen Delivery Method: Circle system utilized Preoxygenation: Pre-oxygenation with 100% oxygen Intubation Type: IV induction Ventilation: Mask ventilation without difficulty Laryngoscope Size: Mac and 3 Grade View: Grade I Tube type: Oral Tube size: 7.5 mm Number of attempts: 1 Airway Equipment and Method: Patient positioned with wedge pillow and Stylet Placement Confirmation: ETT inserted through vocal cords under direct vision,  positive ETCO2 and breath sounds checked- equal and bilateral Secured at: 21 cm Tube secured with: Tape Dental Injury: Teeth and Oropharynx as per pre-operative assessment

## 2015-04-08 NOTE — Anesthesia Preprocedure Evaluation (Signed)
Anesthesia Evaluation  Patient identified by MRN, date of birth, ID band Patient awake    Reviewed: Allergy & Precautions, NPO status , Patient's Chart, lab work & pertinent test results, reviewed documented beta blocker date and time   Airway Mallampati: II  TM Distance: >3 FB     Dental  (+) Chipped   Pulmonary Current Smoker,          Cardiovascular     Neuro/Psych    GI/Hepatic   Endo/Other    Renal/GU      Musculoskeletal   Abdominal   Peds  Hematology   Anesthesia Other Findings   Reproductive/Obstetrics                             Anesthesia Physical Anesthesia Plan  ASA: II  Anesthesia Plan: General   Post-op Pain Management:    Induction: Intravenous  Airway Management Planned: Oral ETT  Additional Equipment:   Intra-op Plan:   Post-operative Plan:   Informed Consent: I have reviewed the patients History and Physical, chart, labs and discussed the procedure including the risks, benefits and alternatives for the proposed anesthesia with the patient or authorized representative who has indicated his/her understanding and acceptance.     Plan Discussed with: CRNA  Anesthesia Plan Comments:         Anesthesia Quick Evaluation  

## 2015-04-08 NOTE — Brief Op Note (Signed)
04/08/2015  10:03 AM  PATIENT:  Ricardo Curtis  38 y.o. male  PRE-OPERATIVE DIAGNOSIS:  Cholelithiasis  POST-OPERATIVE DIAGNOSIS:  Cholelithiasis  PROCEDURE:  Procedure(s): LAPAROSCOPIC CHOLECYSTECTOMY (N/A)  SURGEON:  Surgeon(s) and Role:    * Ida Rogue, MD - Primary  PHYSICIAN ASSISTANT:   ASSISTANTS: none   ANESTHESIA:   general  EBL:   10 ml  BLOOD ADMINISTERED:none  DRAINS: none   LOCAL MEDICATIONS USED:  LIDOCAINE   SPECIMEN:  Excision  DISPOSITION OF SPECIMEN:  PATHOLOGY  COUNTS:  YES  TOURNIQUET:  * No tourniquets in log *  DICTATION: .Note written in EPIC  PLAN OF CARE: Discharge to home after PACU  PATIENT DISPOSITION:  PACU - hemodynamically stable.   Delay start of Pharmacological VTE agent (>24hrs) due to surgical blood loss or risk of bleeding: not applicable

## 2015-04-09 NOTE — Anesthesia Postprocedure Evaluation (Signed)
  Anesthesia Post-op Note  Patient: Ricardo Curtis  Procedure(s) Performed: Procedure(s): LAPAROSCOPIC CHOLECYSTECTOMY (N/A)  Anesthesia type:General  Patient location: PACU  Post pain: Pain level controlled  Post assessment: Post-op Vital signs reviewed, Patient's Cardiovascular Status Stable, Respiratory Function Stable, Patent Airway and No signs of Nausea or vomiting  Post vital signs: Reviewed and stable  Last Vitals:  Filed Vitals:   04/08/15 1200  BP: 129/77  Pulse:   Temp:   Resp: 16    Level of consciousness: awake, alert  and patient cooperative  Complications: No apparent anesthesia complications

## 2015-04-10 LAB — SURGICAL PATHOLOGY

## 2015-04-10 NOTE — Telephone Encounter (Signed)
Patient picked up disability paperwork.

## 2015-04-18 ENCOUNTER — Ambulatory Visit (INDEPENDENT_AMBULATORY_CARE_PROVIDER_SITE_OTHER): Payer: BLUE CROSS/BLUE SHIELD | Admitting: Surgery

## 2015-04-18 ENCOUNTER — Encounter: Payer: Self-pay | Admitting: Surgery

## 2015-04-18 VITALS — BP 113/76 | HR 67 | Temp 97.7°F | Ht 66.0 in | Wt 178.0 lb

## 2015-04-18 DIAGNOSIS — Z09 Encounter for follow-up examination after completed treatment for conditions other than malignant neoplasm: Secondary | ICD-10-CM

## 2015-04-18 MED ORDER — OXYCODONE-ACETAMINOPHEN 5-325 MG PO TABS: 1.0000 | ORAL_TABLET | ORAL | Status: AC | PRN

## 2015-04-18 NOTE — Patient Instructions (Signed)
Please give us a call if you have any questions or concerns. 

## 2015-04-18 NOTE — Progress Notes (Signed)
Surgery clinic note  S: No acute issues.  Min pain but requiring pain meds at night for sleep O:Blood pressure 113/76, pulse 67, temperature 97.7 F (36.5 C), temperature source Oral, height  (1.676 m), weight 178 lb (80.74 kg). GEN: NAD/A&Ox3 ABD: soft, nontender, nondistended   A/P s/p lap cholecystectomy, doing well - rx for pain meds - no heavy lifting x 5 weeks - f/u prn.

## 2015-04-22 NOTE — Discharge Instructions (Signed)
Do not drive on pain medications °Do not lift greater than 15 lbs for a period of 6 weeks °Call or return to ER if you develop fever greater than 101.5, nausea/vomiting, increased pain, redness/drainage from incisions °Take bandages off in 48 hours.  Okay to shower with bandages on or after they come off, no tub baths °

## 2016-06-23 IMAGING — US US ABDOMEN LIMITED
1 series · 14 of 25 positions shown · non-contrast
Comparison: None.

CLINICAL DATA: Epigastric pain.

EXAM:
US ABDOMEN LIMITED - RIGHT UPPER QUADRANT

[Series 1: us abdomen limited · 0.20mm/px · 14 of 45 slices shown]
[im 1/45]
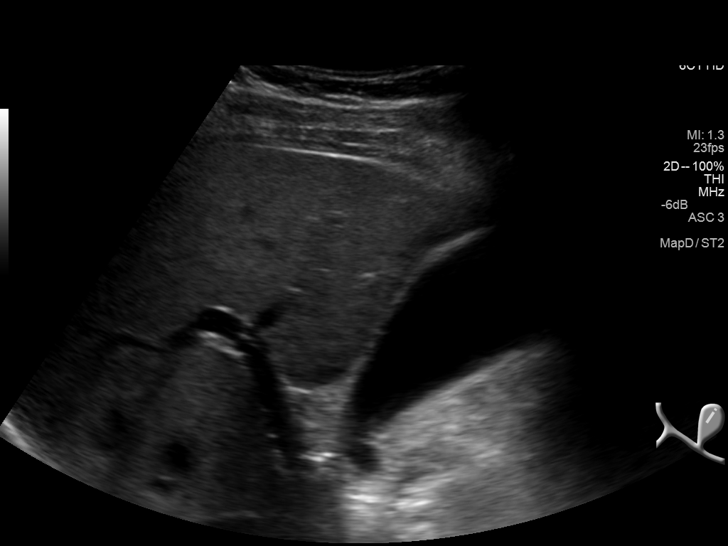
[im 4/45]
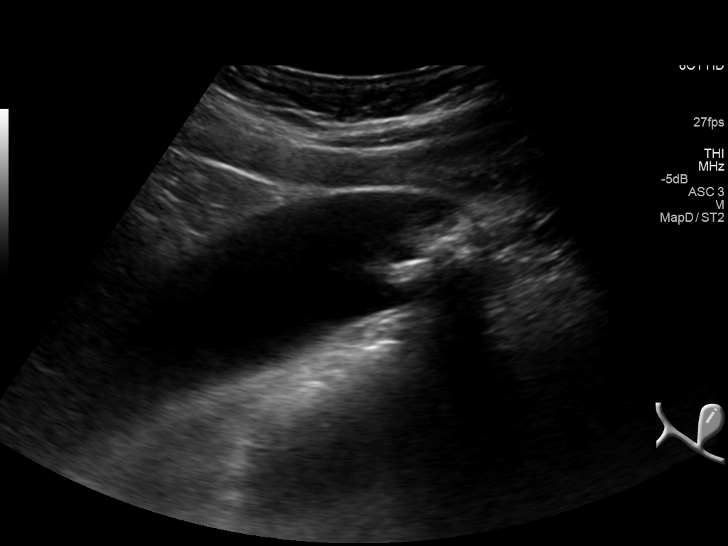
[im 8/45]
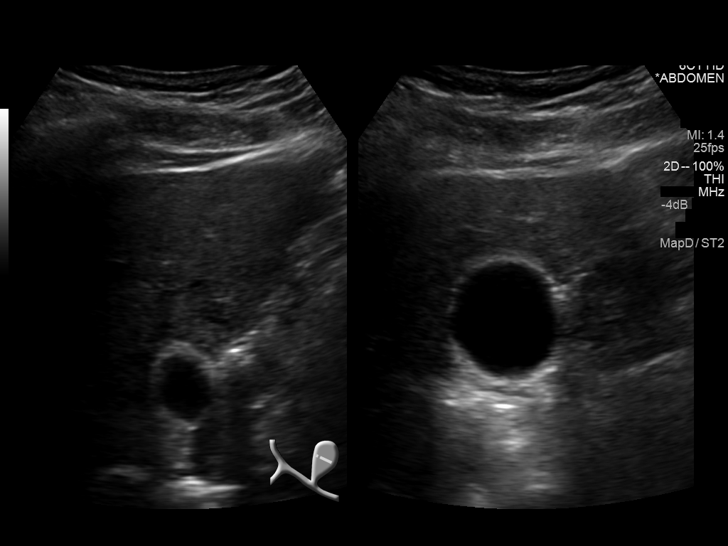
[im 12/45]
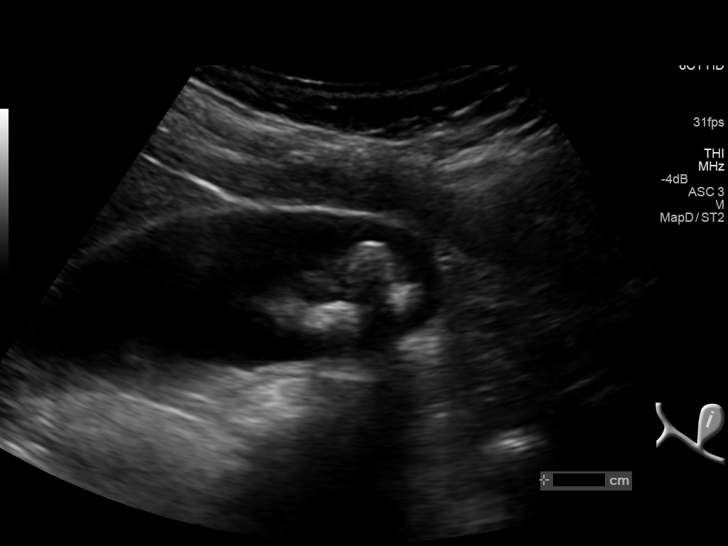
[im 15/45]
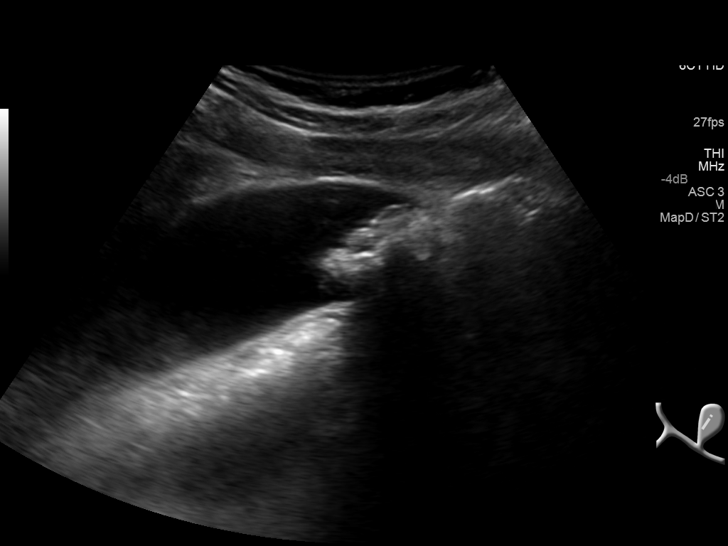
[im 17/45]
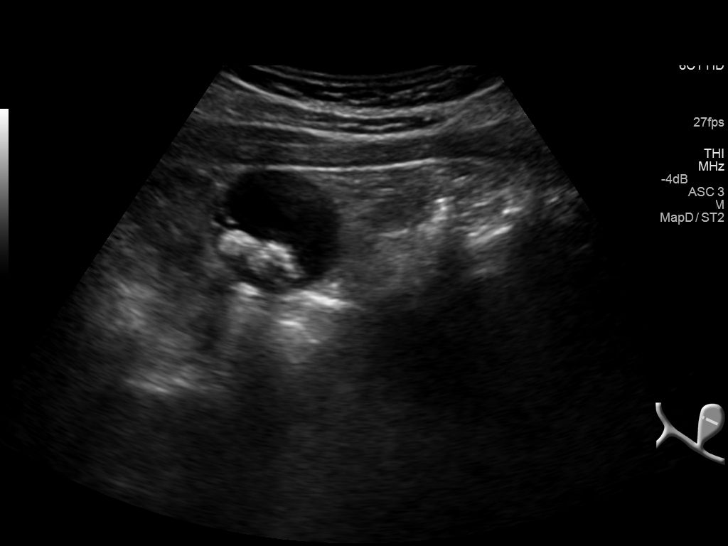
[im 21/45]
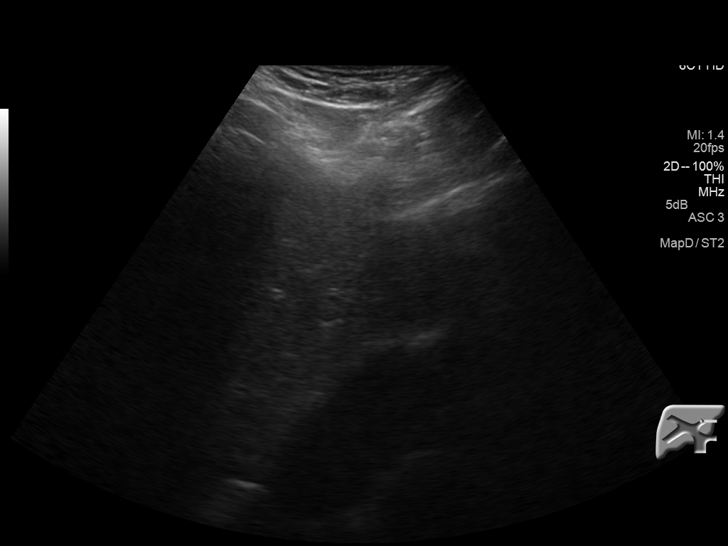
[im 24/45]
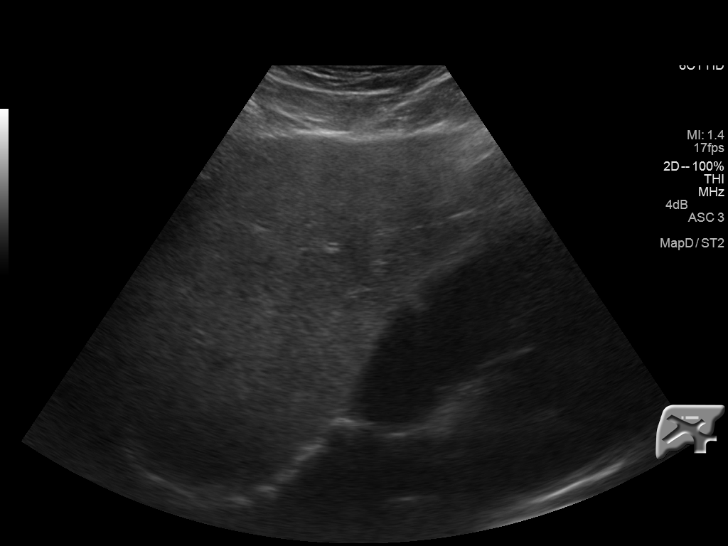
[im 28/45]
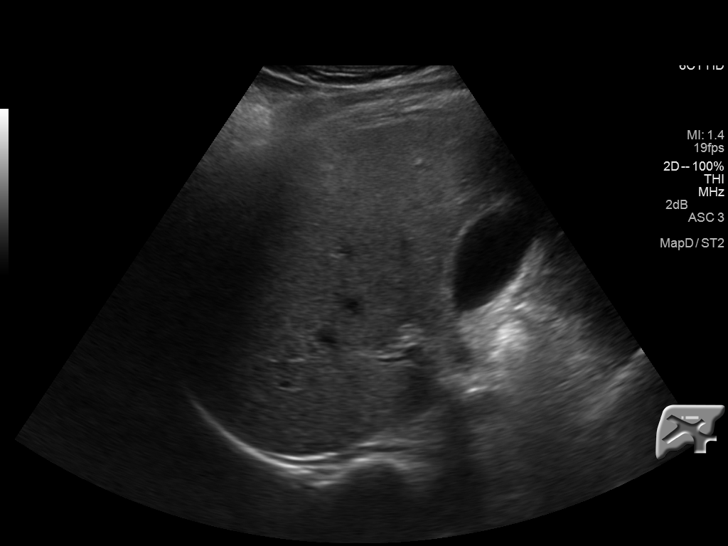
[im 30/45]
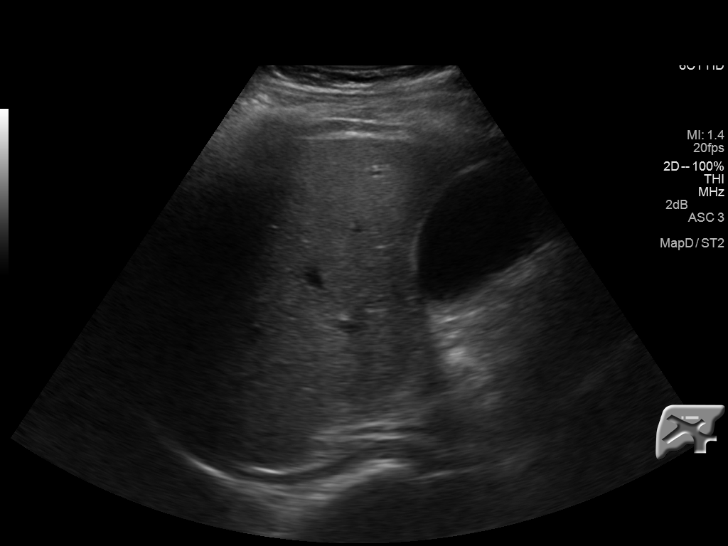
[im 34/45]
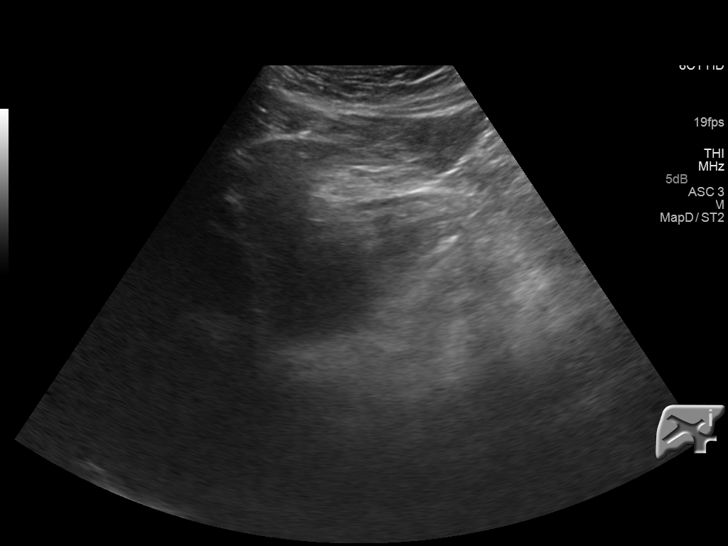
[im 37/45]
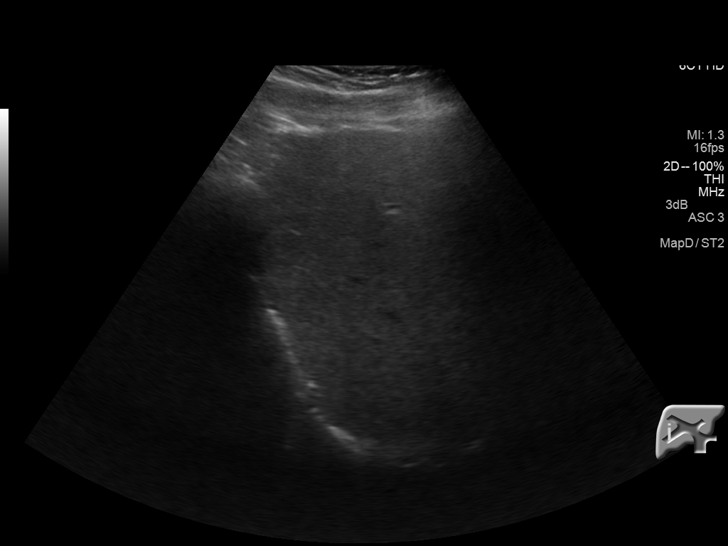
[im 41/45]
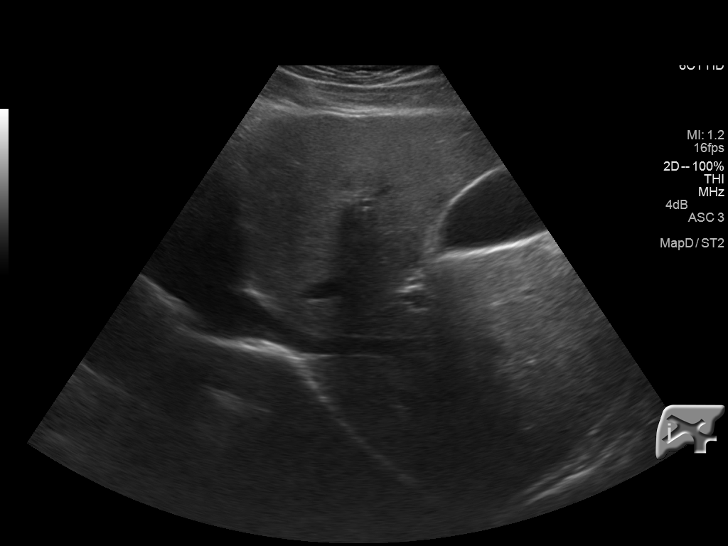
[im 45/45]
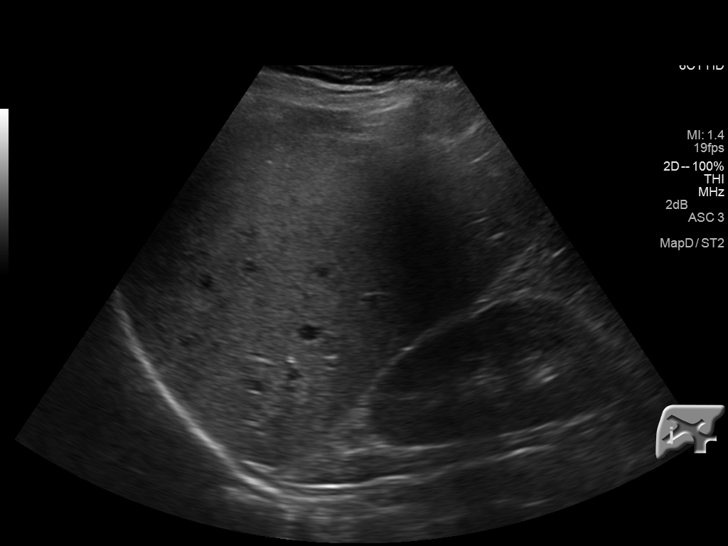

[14 of 25 positions shown; findings below may reference images not displayed]

FINDINGS: Gallbladder:

Echogenic stones at the gallbladder fundus with posterior acoustic
shadowing. Negative for a sonographic Murphy's sign. No significant
gallbladder wall thickening. Largest gallstone measures 1.3 cm.

Common bile duct:

Diameter: 0.5 cm.

Liver:

No focal lesion identified. Within normal limits in parenchymal
echogenicity.
IMPRESSION: Cholelithiasis.  No biliary dilatation.

## 2019-11-03 ENCOUNTER — Ambulatory Visit: Payer: Self-pay | Attending: Internal Medicine

## 2019-11-03 DIAGNOSIS — Z23 Encounter for immunization: Secondary | ICD-10-CM

## 2019-11-03 NOTE — Progress Notes (Signed)
   Covid-19 Vaccination Clinic  Name:  [name]    MRN: [id number] DOB: 1976-03-19  11/03/2019  [name] was observed post Covid-19 immunization for 15 minutes without incident. He was provided with Vaccine Information Sheet and instruction to access the V-Safe system.   [name] was instructed to call 911 with any severe reactions post vaccine: Marland Kitchen Difficulty breathing  . Swelling of face and throat  . A fast heartbeat  . A bad rash all over body  . Dizziness and weakness   Immunizations Administered    Name Date Dose VIS Date Route   Pfizer COVID-19 Vaccine 11/03/2019  2:31 PM 0.3 mL 07/21/2019 Intramuscular   Manufacturer: [employer], [employer]   Lot: [id number]   NDC: [address]-1

## 2019-11-24 ENCOUNTER — Ambulatory Visit: Payer: Self-pay | Attending: Internal Medicine

## 2019-11-24 DIAGNOSIS — Z23 Encounter for immunization: Secondary | ICD-10-CM

## 2019-11-24 NOTE — Progress Notes (Signed)
   Covid-19 Vaccination Clinic  Name:  Jona Zappone    MRN: [id number] DOB: 24-Apr-1976  11/24/2019  [name] was observed post Covid-19 immunization for 15 minutes without incident. He was provided with Vaccine Information Sheet and instruction to access the V-Safe system.   Mr. Ashmore was instructed to call 911 with any severe reactions post vaccine: Marland Kitchen Difficulty breathing  . Swelling of face and throat  . A fast heartbeat  . A bad rash all over body  . Dizziness and weakness   Immunizations Administered    Name Date Dose VIS Date Route   Pfizer COVID-19 Vaccine 11/24/2019  2:02 PM 0.3 mL 07/21/2019 Intramuscular   Manufacturer: [employer], [employer]   Lot: [id number]   NDC: [address]-2

## 2020-05-15 ENCOUNTER — Other Ambulatory Visit: Payer: Self-pay

## 2020-05-15 DIAGNOSIS — Z20822 Contact with and (suspected) exposure to covid-19: Secondary | ICD-10-CM

## 2020-05-16 LAB — NOVEL CORONAVIRUS, NAA: SARS-CoV-2, NAA: NOT DETECTED

## 2020-05-16 LAB — SARS-COV-2, NAA 2 DAY TAT

## 2020-05-18 ENCOUNTER — Telehealth: Payer: Self-pay | Admitting: General Practice

## 2020-05-18 NOTE — Telephone Encounter (Signed)
Pt is aware covid 19 test is neg on 05-18-2020
